# Patient Record
Sex: Female | Born: 1980 | Race: White | Hispanic: No | Marital: Single | Smoking: Never smoker
Health system: Southern US, Community
[De-identification: ages and names within clinical notes are randomized; demographics above are authoritative.]

## PROBLEM LIST (undated history)

## (undated) HISTORY — PX: TUBAL LIGATION: SHX77

## (undated) HISTORY — PX: ABDOMINAL HYSTERECTOMY: SHX81

## (undated) HISTORY — PX: TONSILLECTOMY: SUR1361

---

## 1999-02-01 ENCOUNTER — Emergency Department (HOSPITAL_COMMUNITY): Admission: EM | Admit: 1999-02-01 | Discharge: 1999-02-01 | Payer: Self-pay | Admitting: Emergency Medicine

## 1999-02-01 ENCOUNTER — Encounter: Payer: Self-pay | Admitting: Emergency Medicine

## 1999-03-28 ENCOUNTER — Inpatient Hospital Stay (HOSPITAL_COMMUNITY): Admission: AD | Admit: 1999-03-28 | Discharge: 1999-03-28 | Payer: Self-pay | Admitting: Obstetrics and Gynecology

## 1999-04-14 ENCOUNTER — Other Ambulatory Visit: Admission: RE | Admit: 1999-04-14 | Discharge: 1999-04-14 | Payer: Self-pay | Admitting: Obstetrics and Gynecology

## 1999-06-23 ENCOUNTER — Ambulatory Visit (HOSPITAL_COMMUNITY): Admission: RE | Admit: 1999-06-23 | Discharge: 1999-06-23 | Payer: Self-pay | Admitting: Obstetrics and Gynecology

## 1999-06-23 ENCOUNTER — Encounter: Payer: Self-pay | Admitting: Obstetrics and Gynecology

## 1999-10-09 ENCOUNTER — Inpatient Hospital Stay (HOSPITAL_COMMUNITY): Admission: AD | Admit: 1999-10-09 | Discharge: 1999-10-09 | Payer: Self-pay | Admitting: Obstetrics and Gynecology

## 1999-11-15 ENCOUNTER — Inpatient Hospital Stay (HOSPITAL_COMMUNITY): Admission: AD | Admit: 1999-11-15 | Discharge: 1999-11-15 | Payer: Self-pay | Admitting: *Deleted

## 1999-11-18 ENCOUNTER — Inpatient Hospital Stay (HOSPITAL_COMMUNITY): Admission: AD | Admit: 1999-11-18 | Discharge: 1999-11-18 | Payer: Self-pay | Admitting: Obstetrics & Gynecology

## 1999-11-25 ENCOUNTER — Inpatient Hospital Stay (HOSPITAL_COMMUNITY): Admission: AD | Admit: 1999-11-25 | Discharge: 1999-11-27 | Payer: Self-pay | Admitting: *Deleted

## 2000-05-29 ENCOUNTER — Other Ambulatory Visit: Admission: RE | Admit: 2000-05-29 | Discharge: 2000-05-29 | Payer: Self-pay | Admitting: Obstetrics and Gynecology

## 2000-11-08 ENCOUNTER — Emergency Department (HOSPITAL_COMMUNITY): Admission: EM | Admit: 2000-11-08 | Discharge: 2000-11-08 | Payer: Self-pay | Admitting: Emergency Medicine

## 2001-06-08 ENCOUNTER — Encounter: Payer: Self-pay | Admitting: *Deleted

## 2001-06-08 ENCOUNTER — Emergency Department (HOSPITAL_COMMUNITY): Admission: EM | Admit: 2001-06-08 | Discharge: 2001-06-08 | Payer: Self-pay | Admitting: *Deleted

## 2001-06-22 ENCOUNTER — Other Ambulatory Visit: Admission: RE | Admit: 2001-06-22 | Discharge: 2001-06-22 | Payer: Self-pay | Admitting: *Deleted

## 2001-12-19 ENCOUNTER — Inpatient Hospital Stay (HOSPITAL_COMMUNITY): Admission: AD | Admit: 2001-12-19 | Discharge: 2001-12-21 | Payer: Self-pay | Admitting: *Deleted

## 2002-03-04 ENCOUNTER — Other Ambulatory Visit: Admission: RE | Admit: 2002-03-04 | Discharge: 2002-03-04 | Payer: Self-pay | Admitting: *Deleted

## 2015-12-22 ENCOUNTER — Emergency Department (HOSPITAL_BASED_OUTPATIENT_CLINIC_OR_DEPARTMENT_OTHER): Payer: 59

## 2015-12-22 ENCOUNTER — Emergency Department (HOSPITAL_BASED_OUTPATIENT_CLINIC_OR_DEPARTMENT_OTHER)
Admission: EM | Admit: 2015-12-22 | Discharge: 2015-12-22 | Disposition: A | Discharge status: Home / Self Care | Payer: 59 | Attending: Emergency Medicine | Admitting: Emergency Medicine

## 2015-12-22 ENCOUNTER — Encounter (HOSPITAL_BASED_OUTPATIENT_CLINIC_OR_DEPARTMENT_OTHER): Payer: Self-pay | Admitting: Emergency Medicine

## 2015-12-22 DIAGNOSIS — Y92410 Unspecified street and highway as the place of occurrence of the external cause: Secondary | ICD-10-CM | POA: Insufficient documentation

## 2015-12-22 DIAGNOSIS — Y999 Unspecified external cause status: Secondary | ICD-10-CM | POA: Insufficient documentation

## 2015-12-22 DIAGNOSIS — R079 Chest pain, unspecified: Secondary | ICD-10-CM | POA: Insufficient documentation

## 2015-12-22 DIAGNOSIS — Y939 Activity, unspecified: Secondary | ICD-10-CM | POA: Insufficient documentation

## 2015-12-22 DIAGNOSIS — M545 Low back pain, unspecified: Secondary | ICD-10-CM

## 2015-12-22 MED ORDER — ACETAMINOPHEN 500 MG PO TABS
1000.0000 mg | ORAL_TABLET | Freq: Once | ORAL | Status: AC
  Administered 2015-12-22: 1000 mg via ORAL
  Filled 2015-12-22: qty 2

## 2015-12-22 MED ORDER — IBUPROFEN 400 MG PO TABS
400.0000 mg | ORAL_TABLET | Freq: Once | ORAL | Status: AC
  Administered 2015-12-22: 400 mg via ORAL
  Filled 2015-12-22: qty 1
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
